# Patient Record
Sex: Male | Born: 1995 | Race: White | Hispanic: No | Marital: Single | Smoking: Never smoker
Health system: Southern US, Community
[De-identification: ages and names within clinical notes are randomized; demographics above are authoritative.]

---
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
## 2013-11-15 IMAGING — US US PELVIS LIMITED
1 series · 14 of 24 positions shown · non-contrast
Comparison: none

REASON FOR EXAM: bilateral testicular pain
COMMENTS:

[Series 1: us pelvis limited · 0.08mm/px · 14 of 24 slices shown]
[im 1/24]
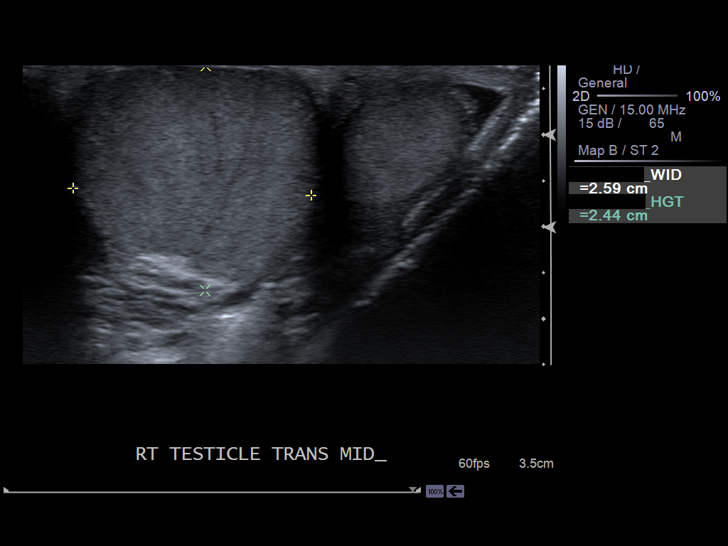
[im 3/24]
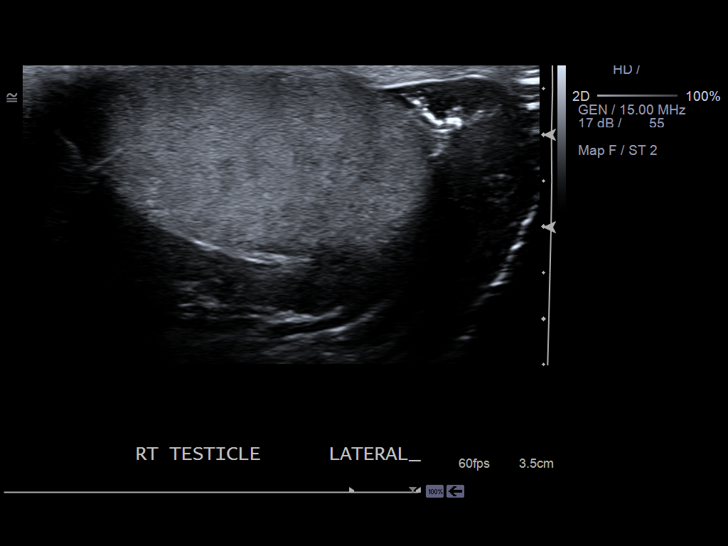
[im 5/24]
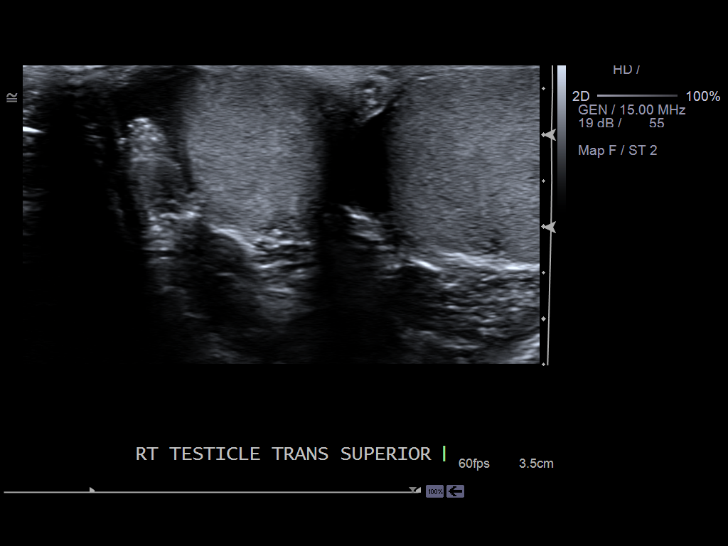
[im 7/24]
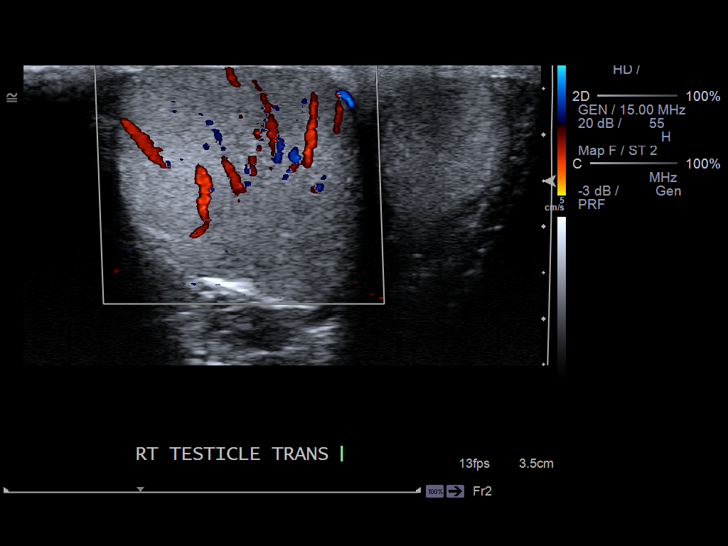
[im 8/24]
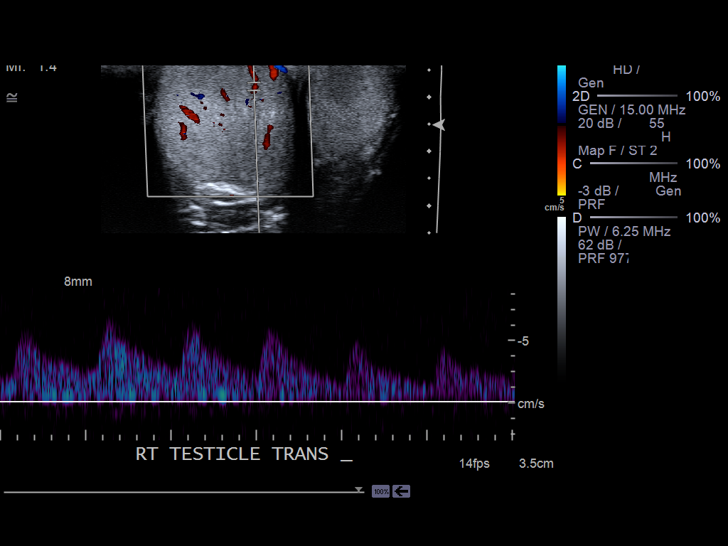
[im 10/24]
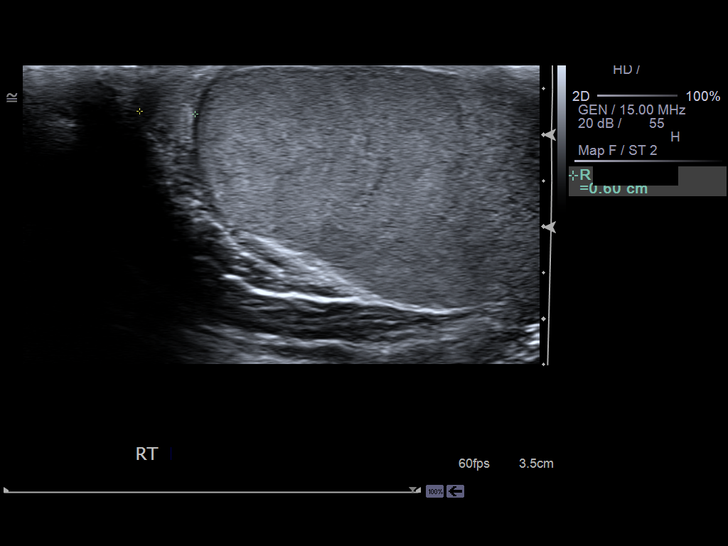
[im 12/24]
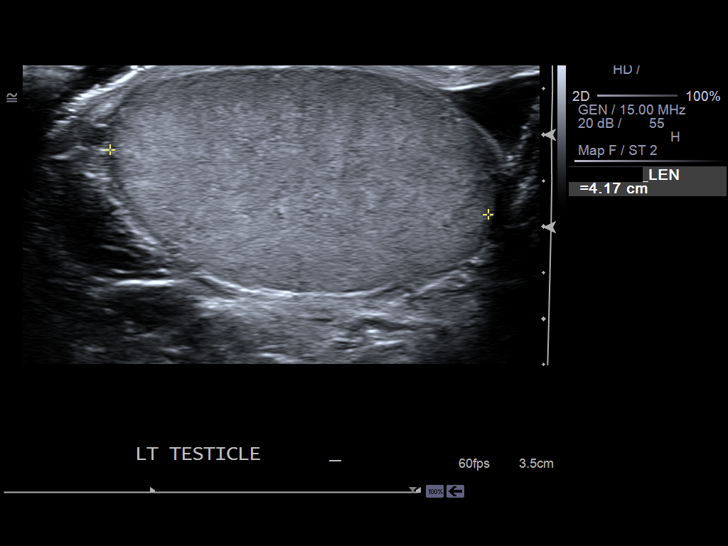
[im 13/24]
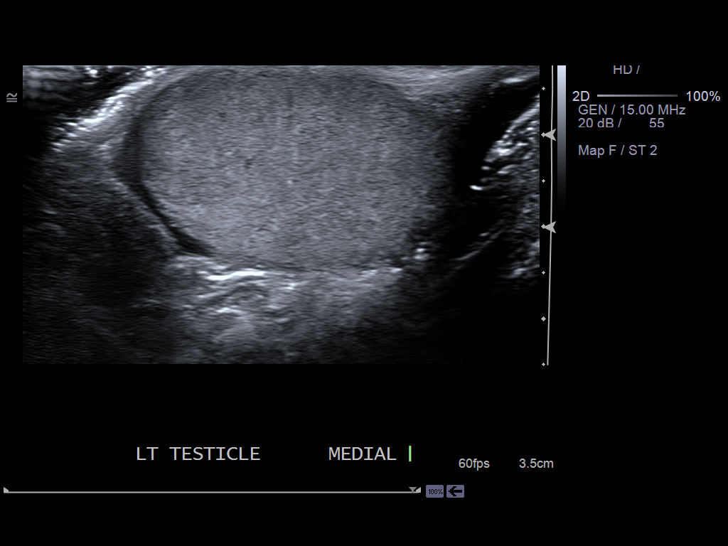
[im 15/24]
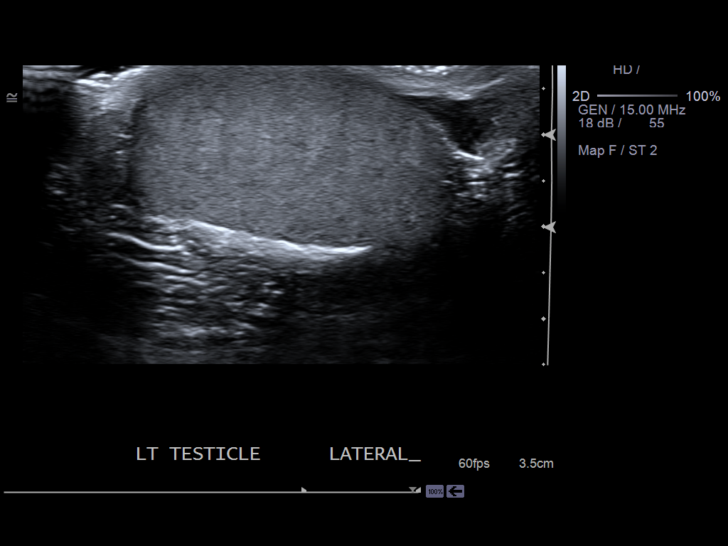
[im 17/24]
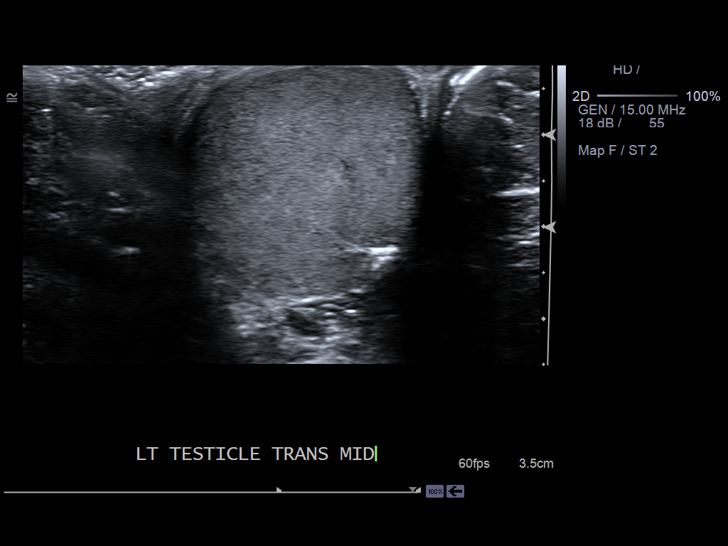
[im 19/24]
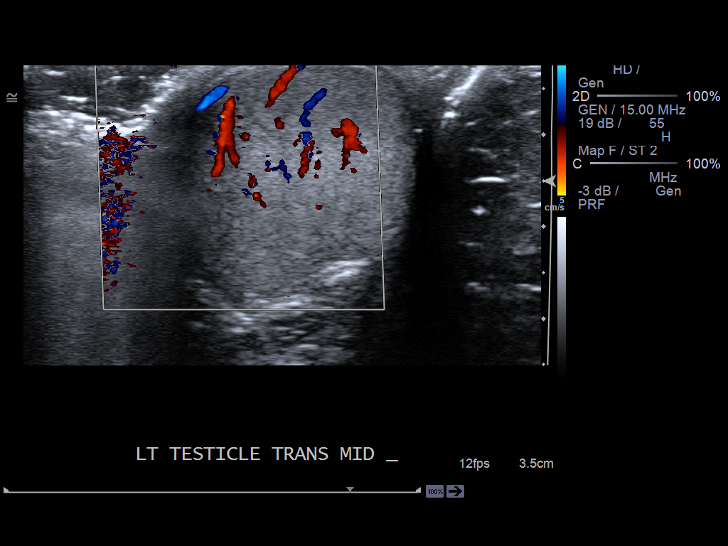
[im 20/24]
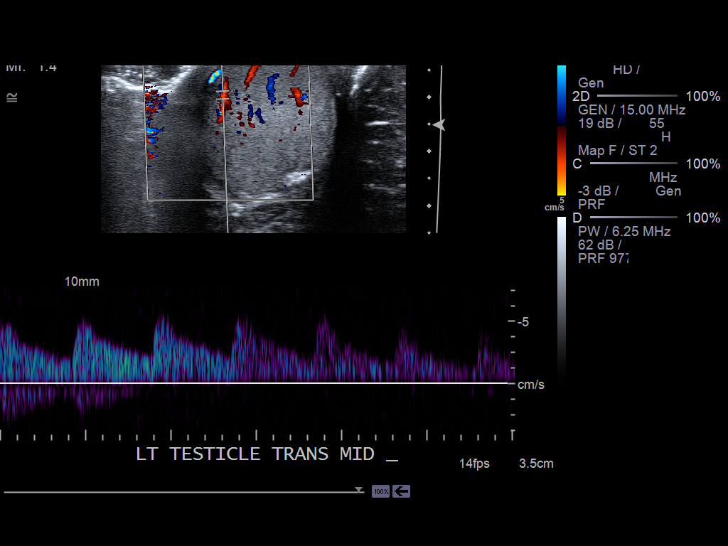
[im 22/24]
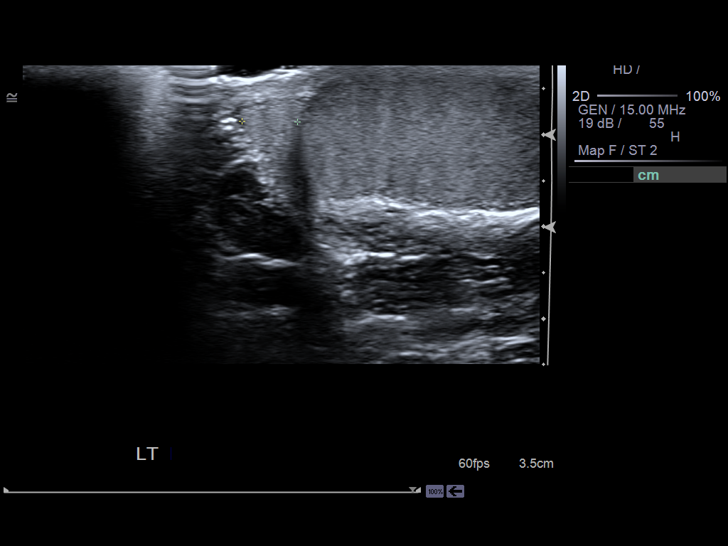
[im 24/24]
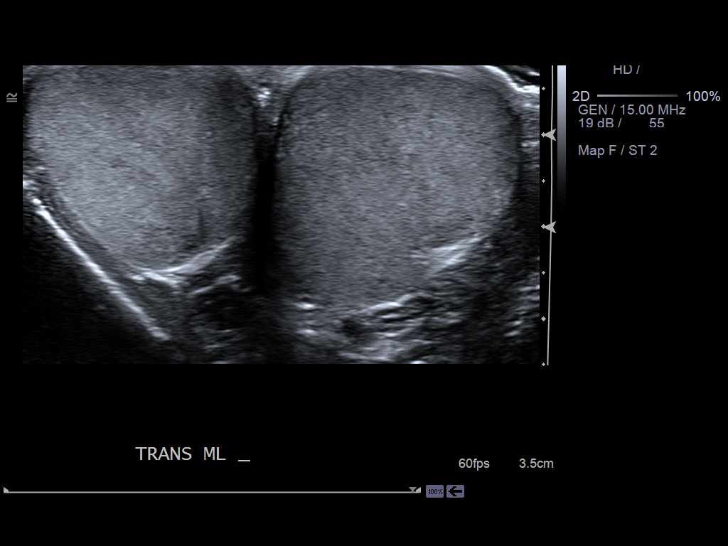

[14 of 24 positions shown; findings below may reference images not displayed]

PROCEDURE:     US  - US TESTICULAR  - June 11, 2012  [DATE]

RESULT:     The testes exhibit normal echotexture and contour and
vascularity. No intratesticular masses are demonstrated and there are no
findings to suggest a fractured testicle. The right testicle measures 4 x
2.6 x 2.4 cm. The left testicle measures 4.2 x 2.5 x 2.4 cm. The epididymal
structures exhibit normal vascularity and contour and echotexture. There is
no evidence of a hydrocele nor varicocele.
IMPRESSION: Normal scrotal ultrasound examination.

[REDACTED]

## 2017-11-08 ENCOUNTER — Other Ambulatory Visit: Payer: Self-pay

## 2017-11-08 ENCOUNTER — Ambulatory Visit
Admission: EM | Admit: 2017-11-08 | Discharge: 2017-11-08 | Disposition: A | Discharge status: Home / Self Care | Payer: No Typology Code available for payment source | Attending: Physician Assistant | Admitting: Physician Assistant

## 2017-11-08 DIAGNOSIS — R0981 Nasal congestion: Secondary | ICD-10-CM | POA: Diagnosis not present

## 2017-11-08 DIAGNOSIS — H1033 Unspecified acute conjunctivitis, bilateral: Secondary | ICD-10-CM | POA: Diagnosis not present

## 2017-11-08 MED ORDER — ERYTHROMYCIN 5 MG/GM OP OINT: TOPICAL_OINTMENT | OPHTHALMIC | 0 refills | Status: DC

## 2017-11-08 MED ORDER — CETIRIZINE HCL 10 MG PO TABS: 10.0000 mg | ORAL_TABLET | Freq: Every day | ORAL | 1 refills | Status: AC

## 2017-11-08 NOTE — ED Triage Notes (Signed)
Patient states he woke up this morning and his left eye was closed due to dried drainage. Redness and drainage in left eye.

## 2017-11-08 NOTE — ED Provider Notes (Signed)
[facility]    CSN: [id number] Arrival date & time: 11/08/17  1050     History   Chief Complaint Chief Complaint  Patient presents with  . Eye Drainage    HPI Isaiah Lawrence is a 22 y.o. male.   Patient is a 22 year old male who states he woke up this morning with crusting of his left eye that has matted shut.  States he had to remove it to be able to open his eye.  He also reports some redness in his right eye has improved.  Patient states he has been washing his eyes at this morning.  Patient does report some recent cold symptoms with mucus buildup that has been responding to over-the-counter treatment.  Patient denies any change in his vision.  Patient states he does not wear contacts.  Patient does report a history of seasonal allergy symptoms in the past.  Patient denies any drug allergies.      History reviewed. No pertinent past medical history.  There are no active problems to display for this patient.   History reviewed. No pertinent surgical history.     Home Medications    Prior to Admission medications   Medication Sig Start Date End Date Taking? Authorizing Provider  cetirizine (ZYRTEC) 10 MG tablet Take 1 tablet (10 mg total) by mouth daily. 11/08/17   Candis Schatz, PA-C  erythromycin ophthalmic ointment Place a 1/2 inch ribbon of ointment into the lower eyelid 3 times a day for 5 days or until cleared 11/08/17   Candis Schatz, PA-C    Family History History reviewed. No pertinent family history.  Social History Social History   Tobacco Use  . Smoking status: Never Smoker  . Smokeless tobacco: Never Used  Substance Use Topics  . Alcohol use: Yes  . Drug use: No     Allergies   Patient has no known allergies.   Review of Systems Review of Systems  As noted above in HPI.  Other systems reviewed and found to be negative   Physical Exam Triage Vital Signs ED Triage Vitals  Enc Vitals Group     BP 11/08/17 1122 113/71    Pulse Rate 11/08/17 1122 79     Resp --      Temp 11/08/17 1122 98 F (36.7 C)     Temp Source 11/08/17 1122 Oral     SpO2 11/08/17 1122 99 %     Weight 11/08/17 1120 192 lb (87.1 kg)     Height 11/08/17 1120 5\' 7"  (1.702 m)     Head Circumference --      Peak Flow --      Pain Score --      Pain Loc --      Pain Edu? --      Excl. in GC? --    No data found.  Updated Vital Signs BP 113/71 (BP Location: Left Arm)   Pulse 79   Temp 98 F (36.7 C) (Oral)   Ht 5\' 7"  (1.702 m)   Wt 192 lb (87.1 kg)   SpO2 99%   BMI 30.07 kg/m   Physical Exam  Constitutional: He appears well-developed and well-nourished.  HENT:  Right Ear: Tympanic membrane normal.  Left Ear: Tympanic membrane normal.  Mouth/Throat: Uvula is midline and oropharynx is clear and moist. Tonsils are 0 on the right. Tonsils are 0 on the left.  Clear postnasal drainage.  Mild throat erythema  Eyes: EOM are normal. Pupils  are equal, round, and reactive to light. Left eye exhibits discharge. Right conjunctiva is injected. Right conjunctiva has no hemorrhage. Left conjunctiva is injected (L > R). Left conjunctiva has no hemorrhage.     UC Treatments / Results  Labs (all labs ordered are listed, but only abnormal results are displayed) Labs Reviewed - No data to display  EKG  EKG Interpretation None       Radiology No results found.  Procedures Procedures (including critical care time)  Medications Ordered in UC Medications - No data to display   Initial Impression / Assessment and Plan / UC Course  I have reviewed the triage vital signs and the nursing notes.  Pertinent labs & imaging results that were available during my care of the patient were reviewed by me and considered in my medical decision making (see chart for details).     Patient with some upper respiratory congestion as well as conjunctivitis.  Patient with some matting shut this morning.  We will have him give prescription for  erythromycin ointment as well as Zyrtec.  Patient also advised to get some over-the-counter lubricating drops for comfort.  Final Clinical Impressions(s) / UC Diagnoses   Final diagnoses:  Nasal congestion  Acute conjunctivitis of both eyes, unspecified acute conjunctivitis type    ED Discharge Orders        Ordered    cetirizine (ZYRTEC) 10 MG tablet  Daily     11/08/17 1153    erythromycin ophthalmic ointment     11/08/17 1153       Controlled Substance Prescriptions [place] Controlled Substance Registry consulted? Not Applicable   Candis SchatzHarris, Dimples Probus D, PA-C 11/08/17 1154

## 2017-11-08 NOTE — Discharge Instructions (Signed)
-  Erythromycin eye ointment: As directed 3 times a day for 5 days or until resolved -Zyrtec 1 tablet daily -May get over-the-counter lubricating eyedrops for comfort -Follow with primary care provider as needed

## 2018-03-05 ENCOUNTER — Encounter: Payer: Self-pay | Admitting: Emergency Medicine

## 2018-03-05 ENCOUNTER — Other Ambulatory Visit: Payer: Self-pay

## 2018-03-05 ENCOUNTER — Ambulatory Visit
Admission: EM | Admit: 2018-03-05 | Discharge: 2018-03-05 | Disposition: A | Discharge status: Home / Self Care | Payer: BLUE CROSS/BLUE SHIELD | Attending: Emergency Medicine | Admitting: Emergency Medicine

## 2018-03-05 DIAGNOSIS — B029 Zoster without complications: Secondary | ICD-10-CM | POA: Diagnosis not present

## 2018-03-05 MED ORDER — TRIAMCINOLONE ACETONIDE 0.1 % EX CREA: 1.0000 "application " | TOPICAL_CREAM | Freq: Two times a day (BID) | CUTANEOUS | 0 refills | Status: AC

## 2018-03-05 MED ORDER — MUPIROCIN 2 % EX OINT: 1.0000 "application " | TOPICAL_OINTMENT | Freq: Three times a day (TID) | CUTANEOUS | 0 refills | Status: AC

## 2018-03-05 NOTE — ED Provider Notes (Signed)
HPI  SUBJECTIVE:  Isaiah Lawrence is a 22 y.o. male who presents with mildly itchy, burning rash on his right abdomen in a bandlike distribution starting 6 days ago.  It is occasionally painful when something rubs against it.  Denies proceeding paresthesias, fevers, body aches, flulike symptoms.  No new lotions, but he is using a new body wash.  No new detergents, medications, exposure to poison ivy poison oak.  He denies being under increased amounts of stress recently. Patient denies new crops of vesicles within the past 72 hours.  His grandmother has a very similar rash currently, was seen here earlier today, diagnosed with shingles. He Is concerned that he may have the same thing.  He tried Gold bond with some improvement in his symptoms and has been trying to keep it clean.  No aggravating factors.  Past medical history of chickenpox.  No history of eczema, diabetes, hypertension, immunocompromise.  PMD: None.  History reviewed. No pertinent past medical history.  History reviewed. No pertinent surgical history.  History reviewed. No pertinent family history.  Social History   Tobacco Use  . Smoking status: Never Smoker  . Smokeless tobacco: Never Used  Substance Use Topics  . Alcohol use: Never    Frequency: Never  . Drug use: No    No current facility-administered medications for this encounter.   Current Outpatient Medications:  .  cetirizine (ZYRTEC) 10 MG tablet, Take 1 tablet (10 mg total) by mouth daily., Disp: 30 tablet, Rfl: 1 .  mupirocin ointment (BACTROBAN) 2 %, Apply 1 application topically 3 (three) times daily., Disp: 22 g, Rfl: 0 .  triamcinolone cream (KENALOG) 0.1 %, Apply 1 application topically 2 (two) times daily. Apply for 2 weeks. May use on face, Disp: 30 g, Rfl: 0  No Known Allergies   ROS  As noted in HPI.   Physical Exam  BP 113/68 (BP Location: Left Arm)   Pulse 82   Temp 98.3 F (36.8 C) (Oral)   Resp 18   Ht 5\' 7"  (1.702 m)   Wt 195 lb (88.5  kg)   SpO2 98%   BMI 30.54 kg/m   Constitutional: Well developed, well nourished, no acute distress Eyes:  EOMI, conjunctiva normal bilaterally HENT: Normocephalic, atraumatic,mucus membranes moist Respiratory: Normal inspiratory effort Cardiovascular: Normal rate GI: nondistended skin: Nontender blanchable vesicular rash in a dermatomal distribution over the right abdomen and spreading to the back.  see pictures         musculoskeletal: no deformities Neurologic: Alert & oriented x 3, no focal neuro deficits Psychiatric: Speech and behavior appropriate   ED Course   Medications - No data to display  No orders of the defined types were placed in this encounter.   No results found for this or any previous visit (from the past 24 hour(s)). No results found.  ED Clinical Impression  Herpes zoster without complication   ED Assessment/Plan  Presentation suggestive of shingles.  Discussed with patient that his grandmother did not give this to him.  He has not had any new lesions in 72 hours so there is no clear indication for initiating antiviral therapy.  Will send home with triamcinolone for the itching and Bactroban to help prevent secondary infection.  Will provide primary care referral list for routine ongoing care.  Discussed MDM, treatment plan, and plan for follow-up with patient. . patient agrees with plan.   Meds ordered this encounter  Medications  . mupirocin ointment (BACTROBAN) 2 %  Sig: Apply 1 application topically 3 (three) times daily.    Dispense:  22 g    Refill:  0  . triamcinolone cream (KENALOG) 0.1 %    Sig: Apply 1 application topically 2 (two) times daily. Apply for 2 weeks. May use on face    Dispense:  30 g    Refill:  0    *This clinic note was created using Scientist, clinical (histocompatibility and immunogenetics)Dragon dictation software. Therefore, there may be occasional mistakes despite careful proofreading.   ?   Domenick GongMortenson, Bhavin Monjaraz, MD 03/05/18 1700

## 2018-03-05 NOTE — ED Triage Notes (Signed)
Patient c/o rash to right upper abdomen and around back area x 5 days. Has tried OTC Goldbond cream with no relief. Grandmother was just diagnosed with shingles.

## 2018-03-05 NOTE — Discharge Instructions (Addendum)
Apply the triamcinolone for itching and Bactroban to help prevent secondary infection.  Keep the rash covered and wash your hands often to prevent the spread of the virus to others.    Here is a list of primary care providers who are taking new patients:  Dr. Elizabeth Sauereanna Jones, Dr. Schuyler AmorWilliam Plonk [address] [place] [address] [phone]  [facility] [place] [address]  [place] [address]  [phone]  [facility] [address]  [place], [place] [address] [phone]  [facility] [place] [address]  [phone] [place], [place] [address]  Here are clinics/ other resources who will see you if you do not have insurance. Some have certain criteria that you must meet. Call them and find out what they are:  [facility]: [address] [place] [address] [place], [place] [address] Phone: [phone] Hours: First and Third Saturdays of each Month, 9 a.m. - 1 p.m.  [facility]: [address], [place], [place] [address] Phone: [phone] Hours: Tuesday, 4 p.m. - 8 p.m. Thursday, 1 p.m. - 8 p.m. Wednesday, 9 a.m. - [facility]   [facility] [address], [place], [place] [address] Phone: [phone] Pharmacy Phone Number: [phone] Dental Phone Number: [phone] [facility] Insurance Help: [phone]  Dental Hours: Monday - Thursday, 8 a.m. - 6 p.m.  [facility] [address] [place], [place] [address] Phone: [phone] Pharmacy Phone Number: [phone] [facility] Insurance Help: [phone]  [facility] [address] [place], [place] [address] Phone: [phone] Pharmacy Phone Number: [phone] [facility] Insurance Help: [phone]  [facility] [address] [place], [place] [address] Phone: [phone] [facility] Insurance Help: [phone]   [facility] [address] [place], [place] [address] Phone: [phone]  Go to  www.goodrx.com to look up your medications. This will give you a list of where you can find your prescriptions at the most affordable prices. Or ask the pharmacist what the cash price is, or if they have any other discount programs available to help make your medication more affordable. This can be less expensive than what you would pay with insurance.
